# Patient Record
Sex: Male | Born: 1995 | Race: White | Hispanic: No | Marital: Single | Smoking: Never smoker
Health system: Southern US, Community
[De-identification: ages and names within clinical notes are randomized; demographics above are authoritative.]

## PROBLEM LIST (undated history)

## (undated) HISTORY — PX: APPENDECTOMY: SHX54

---
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
## 2017-04-01 IMAGING — CR DG CHEST 2V
2 series · 2 of 2 positions shown · non-contrast
Comparison: Chest radiograph performed 05/06/2016

CLINICAL DATA: Chronic productive cough. Acute onset of congestion
and fever. Initial encounter.

EXAM:
CHEST  2 VIEW

[chest pa]
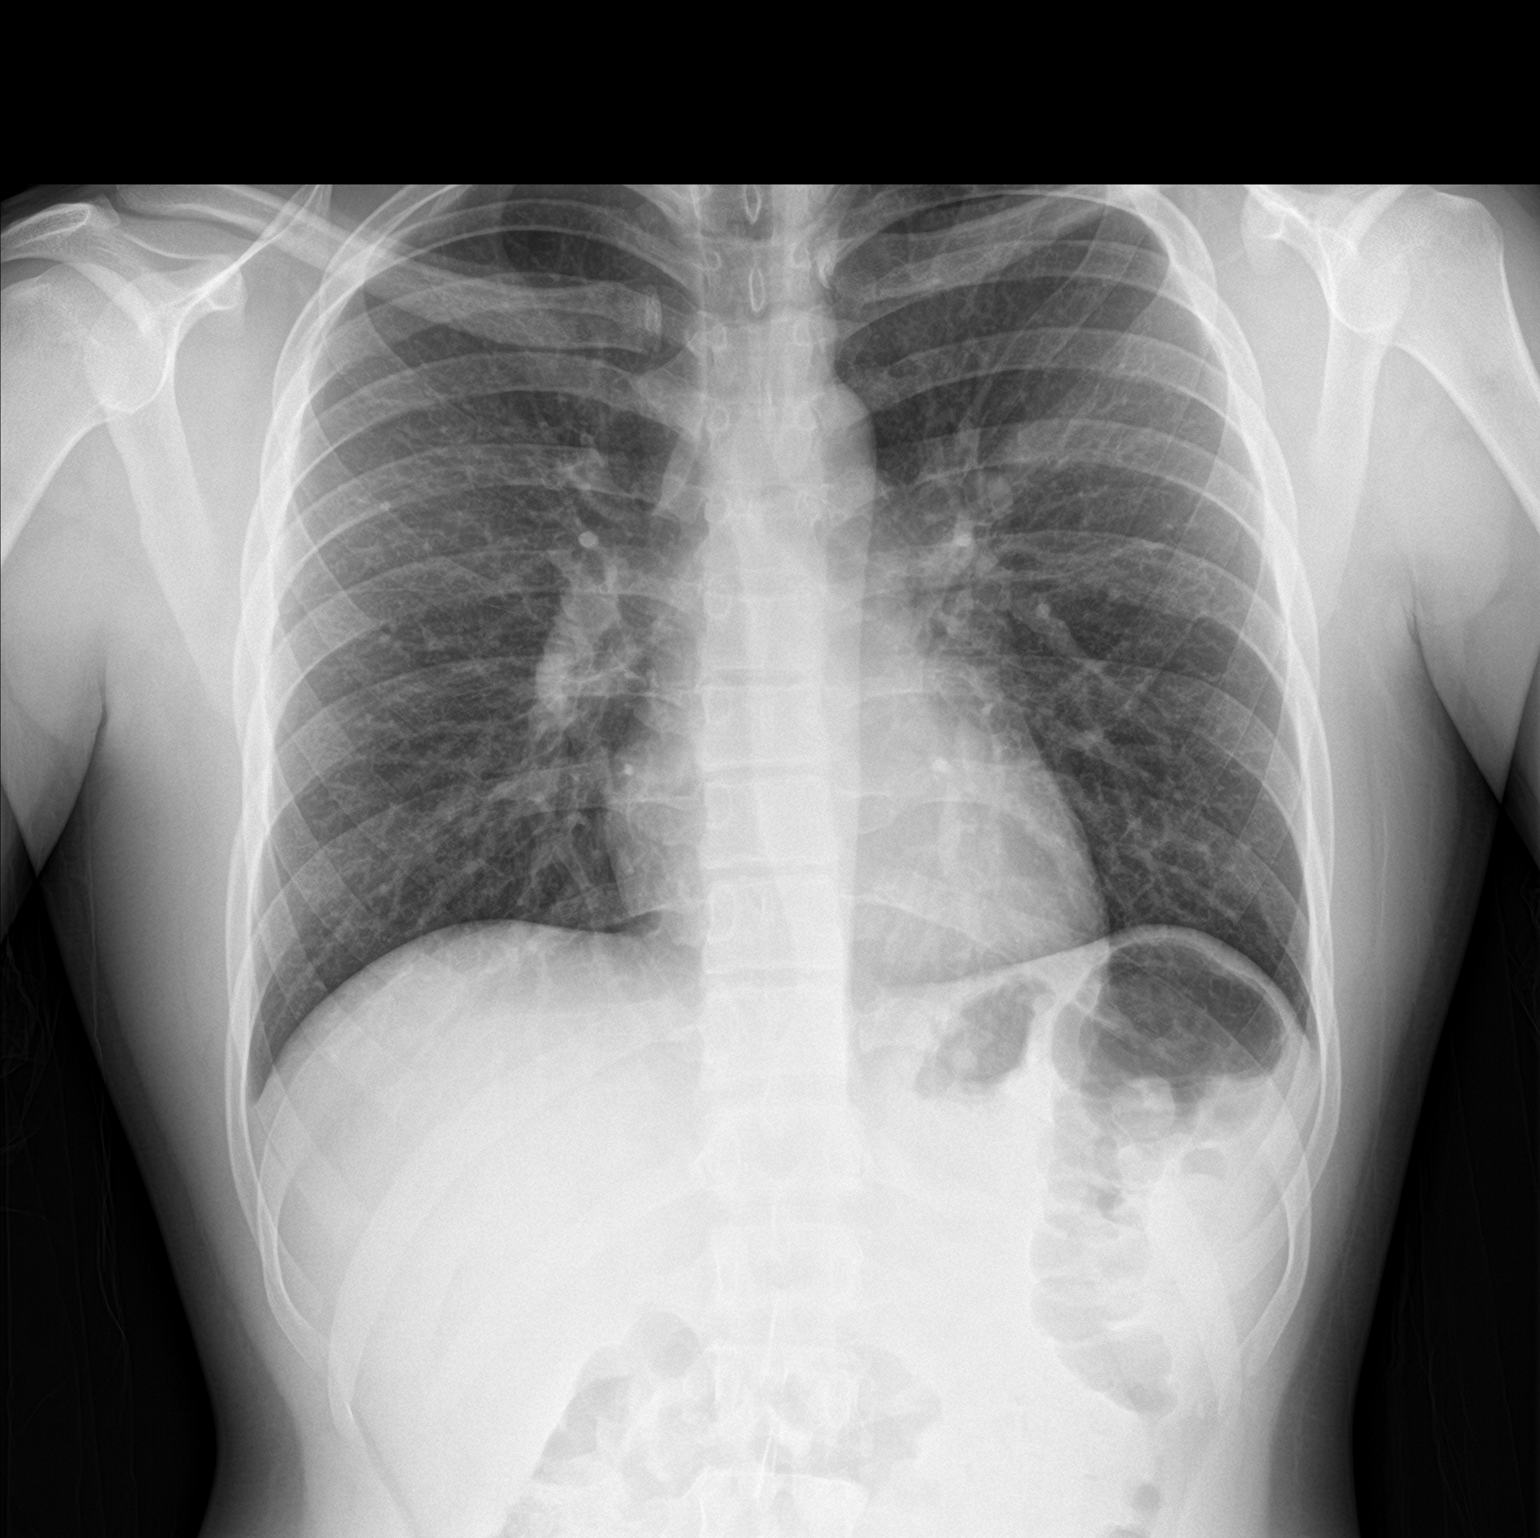

[chest lat]
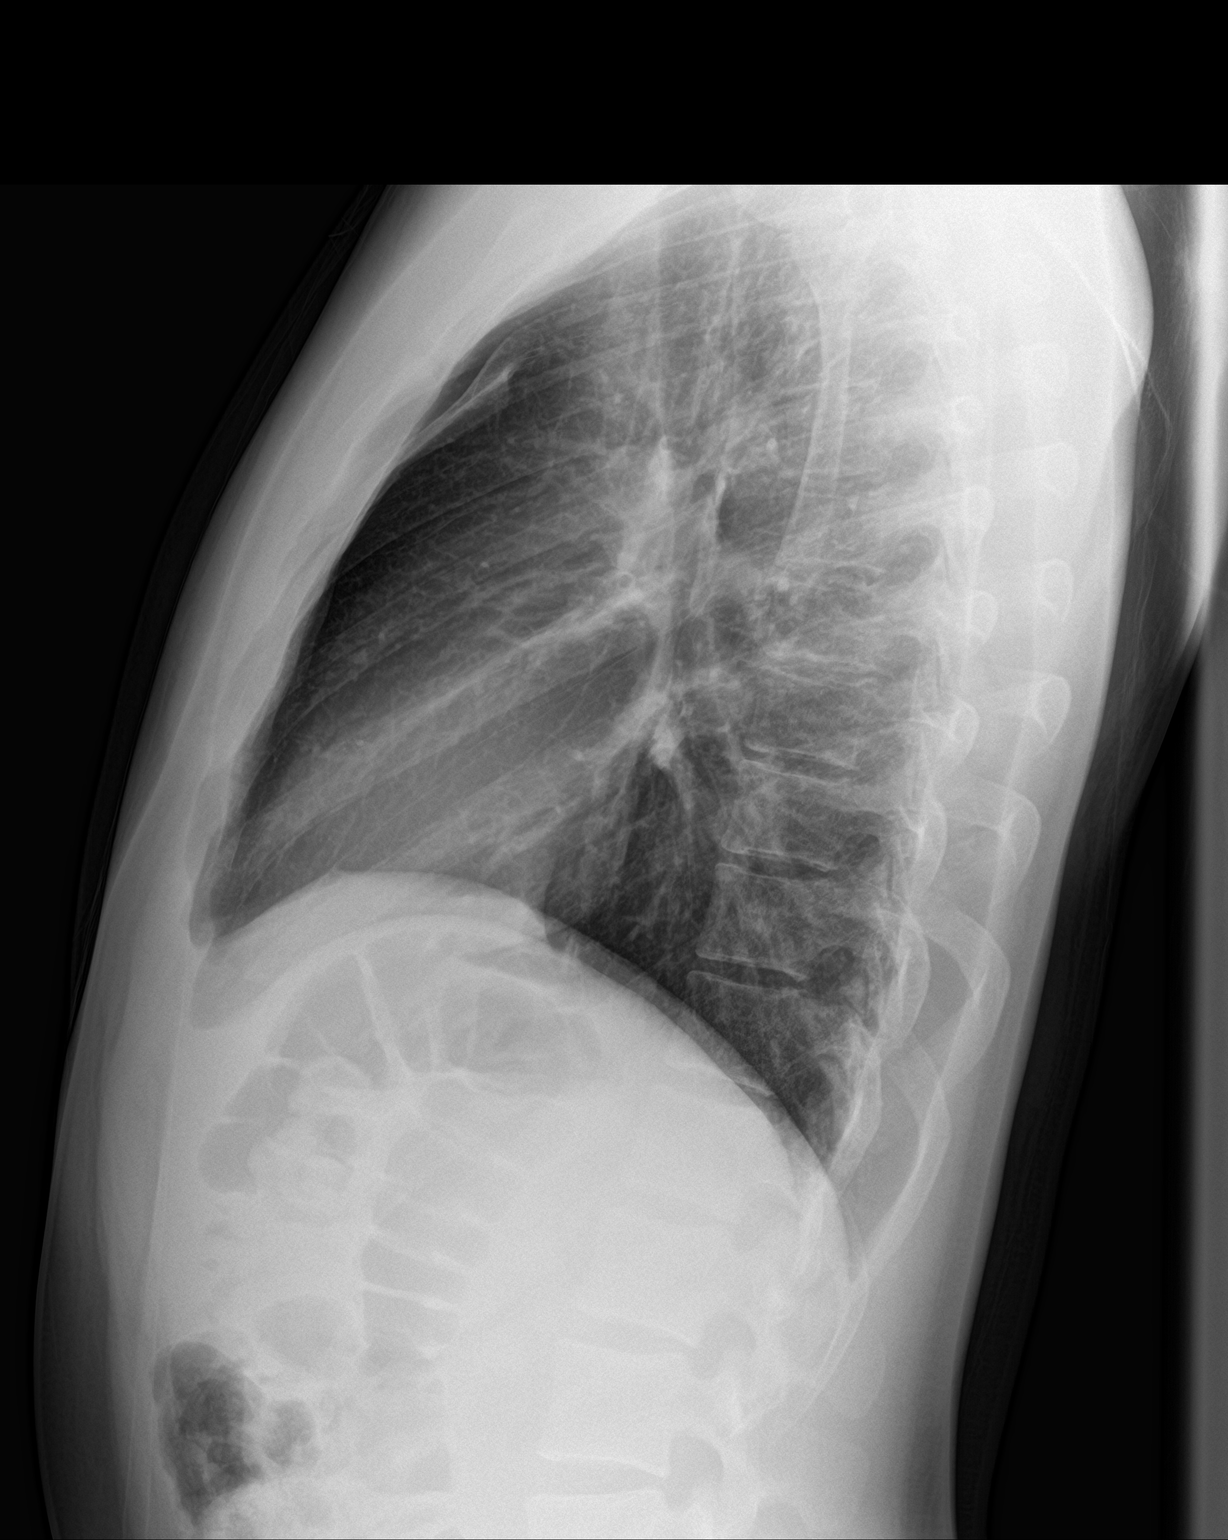

[2 of 2 positions shown; findings below may reference images not displayed]

FINDINGS: The lungs are well-aerated and clear. There is no evidence of focal
opacification, pleural effusion or pneumothorax.

The heart is normal in size; the mediastinal contour is within
normal limits. No acute osseous abnormalities are seen.
IMPRESSION: No acute cardiopulmonary process seen.

## 2017-12-24 ENCOUNTER — Ambulatory Visit: Payer: Self-pay | Admitting: Pediatrics

## 2017-12-25 ENCOUNTER — Ambulatory Visit (INDEPENDENT_AMBULATORY_CARE_PROVIDER_SITE_OTHER): Payer: Medicaid Other | Admitting: Pediatrics

## 2017-12-25 ENCOUNTER — Encounter: Payer: Self-pay | Admitting: Pediatrics

## 2017-12-25 VITALS — BP 118/76 | Temp 97.5°F | Wt 159.0 lb

## 2017-12-25 DIAGNOSIS — R51 Headache: Secondary | ICD-10-CM

## 2017-12-25 DIAGNOSIS — R519 Headache, unspecified: Secondary | ICD-10-CM

## 2017-12-25 DIAGNOSIS — R42 Dizziness and giddiness: Secondary | ICD-10-CM

## 2017-12-25 NOTE — Patient Instructions (Signed)
Dizziness °Dizziness is a common problem. It is a feeling of unsteadiness or light-headedness. You may feel like you are about to faint. Dizziness can lead to injury if you stumble or fall. Anyone can become dizzy, but dizziness is more common in older adults. This condition can be caused by a number of things, including medicines, dehydration, or illness. °Follow these instructions at home: °Eating and drinking °· Drink enough fluid to keep your urine clear or pale yellow. This helps to keep you from becoming dehydrated. Try to drink more clear fluids, such as water. °· Do not drink alcohol. °· Limit your caffeine intake if told to do so by your health care provider. Check ingredients and nutrition facts to see if a food or beverage contains caffeine. °· Limit your salt (sodium) intake if told to do so by your health care provider. Check ingredients and nutrition facts to see if a food or beverage contains sodium. °Activity °· Avoid making quick movements. °? Rise slowly from chairs and steady yourself until you feel okay. °? In the morning, first sit up on the side of the bed. When you feel okay, stand slowly while you hold onto something until you know that your balance is fine. °· If you need to stand in one place for a long time, move your legs often. Tighten and relax the muscles in your legs while you are standing. °· Do not drive or use heavy machinery if you feel dizzy. °· Avoid bending down if you feel dizzy. Place items in your home so that they are easy for you to reach without leaning over. °Lifestyle °· Do not use any products that contain nicotine or tobacco, such as cigarettes and e-cigarettes. If you need help quitting, ask your health care provider. °· Try to reduce your stress level by using methods such as yoga or meditation. Talk with your health care provider if you need help to manage your stress. °General instructions °· Watch your dizziness for any changes. °· Take over-the-counter and  prescription medicines only as told by your health care provider. Talk with your health care provider if you think that your dizziness is caused by a medicine that you are taking. °· Tell a friend or a family member that you are feeling dizzy. If he or she notices any changes in your behavior, have this person call your health care provider. °· Keep all follow-up visits as told by your health care provider. This is important. °Contact a health care provider if: °· Your dizziness does not go away. °· Your dizziness or light-headedness gets worse. °· You feel nauseous. °· You have reduced hearing. °· You have new symptoms. °· You are unsteady on your feet or you feel like the room is spinning. °Get help right away if: °· You vomit or have diarrhea and are unable to eat or drink anything. °· You have problems talking, walking, swallowing, or using your arms, hands, or legs. °· You feel generally weak. °· You are not thinking clearly or you have trouble forming sentences. It may take a friend or family member to notice this. °· You have chest pain, abdominal pain, shortness of breath, or sweating. °· Your vision changes. °· You have any bleeding. °· You have a severe headache. °· You have neck pain or a stiff neck. °· You have a fever. °These symptoms may represent a serious problem that is an emergency. Do not wait to see if the symptoms will go away. Get medical help   right away. Call your local emergency services (911 in the [place] Do not drive yourself to the hospital. °Summary °· Dizziness is a feeling of unsteadiness or light-headedness. This condition can be caused by a number of things, including medicines, dehydration, or illness. °· Anyone can become dizzy, but dizziness is more common in older adults. °· Drink enough fluid to keep your urine clear or pale yellow. Do not drink alcohol. °· Avoid making quick movements if you feel dizzy. Monitor your dizziness for any changes. °This information is not intended to  replace advice given to you by your health care provider. Make sure you discuss any questions you have with your health care provider. °Document Released: 11/01/2000 Document Revised: 06/10/2016 Document Reviewed: 06/10/2016 °Elsevier Interactive Patient Education © 2018 Elsevier Inc. ° °

## 2017-12-25 NOTE — Progress Notes (Signed)
  Subjective:     Patient ID: Charles Vega, male   DOB: 12/19/1995, 22 y.o.   MRN: [id number]  HPI  The patient is here alone for concern about dizziness. He states that this happened about one week ago, while he was playing soccer and it was hot outside. Then, he went to sit in his car and it lasted for about 2 minutes, then it passed. He states that he did drink plenty of water.  He states that he felt dizzy again the next day, he states that he was not doing anything that day, except for hang out with friends and "talk". He states that he did not play soccer that day or any activities.  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 He states that he felt dizzy again at the end of the night, and it was very busy, and he started to feel dizzy. He went to the bathroom to "calm himself" and he felt that he was dizzy. He states that the dizziness lasted about 4 to 5 minutes. He states that he usually eats at least 2 meals a day and drinks water and Gatorades.  He states that now will sometimes feel dizzy when he is walking.  He also states that he has had about 3 all over his head headaches over the past one week and 1/2, since the dizziness started.   Patient is not aware of any family history of any sudden death in his immediate family.    Review of Systems   .Review of Symptoms: General ROS: negative for - fatigue ENT ROS: negative for - nasal congestion Respiratory ROS: no cough, shortness of breath, or wheezing Cardiovascular ROS: no chest pain or dyspnea on exertion Gastrointestinal ROS: no abdominal pain, change in bowel habits, or black or bloody stools  Objective:   Physical Exam BP 118/76   Temp (!) 97.5 F (36.4 C)   Wt 159 lb (72.1 kg)   BMI 24.90 kg/m   General Appearance:  Alert, cooperative, no distress, appears stated age  Head:  Normocephalic, without obvious abnormality, atraumatic  Eyes:  PERRL, conjunctiva/corneas clear, EOM's intact,  fundi benign, both eyes  Ears:  Normal TM's and external ear canals, both ears  Nose: Nares normal, septum midline, mucosa normal, no drainage or sinus tenderness  Throat: Lips, mucosa, and tongue normal; teeth and gums normal  Neck: Supple, symmetrical, trachea midline, no adenopathy  Lungs:   Clear to auscultation bilaterally, respirations unlabored  Heart:  Regular rate and rhythm, S1, S2 normal, no murmur, rub or gallop  Abdomen:   Soft, non-tender, bowel sounds active all four quadrants,  no masses, no organomegaly  Neurologic: Normal       Assessment:     Dizziness Headaches     Plan:     .1. Dizziness Increase water intake to at least 60 ounces per day  Eat at least 3 meals a day Avoid being in environments where he can become very hot  - Ambulatory referral to Neurology  2. Acute nonintractable headache, unspecified headache type Monitor for triggers  Good sleep hygiene  Minimal screen time  - Ambulatory referral to Neurology

## 2017-12-31 ENCOUNTER — Encounter: Payer: Self-pay | Admitting: Neurology

## 2018-02-21 ENCOUNTER — Ambulatory Visit: Payer: Medicaid Other | Admitting: Neurology

## 2018-03-05 ENCOUNTER — Ambulatory Visit (INDEPENDENT_AMBULATORY_CARE_PROVIDER_SITE_OTHER): Payer: Medicaid Other

## 2018-03-05 DIAGNOSIS — Z23 Encounter for immunization: Secondary | ICD-10-CM | POA: Diagnosis not present

## 2018-03-20 ENCOUNTER — Encounter: Payer: Self-pay | Admitting: Pediatrics

## 2018-05-31 ENCOUNTER — Telehealth: Payer: Self-pay

## 2018-05-31 NOTE — Telephone Encounter (Signed)
Agree 

## 2018-05-31 NOTE — Telephone Encounter (Signed)
Pt has been having symptoms of cough, body aches, sore throat, "fever" not taken with thermometer just by touch x 4 days. Let pt know that this sounds like possible flu. Due to it being more than 48 hours let pt know that there is nothing we can do for it other than to treat the symptoms. Let pt know that he can take tylenol and motrin if needed for the possible fever, sore throat and body aches. Let pt know that the flu can take about a 1 week to clear up in the mean time to try the above suggestions, make sure to cover mouth when sneezing and coughing, wash hand often with soap and water, limit contact with other to prevent spread and to wait until 24 hours after the fever is gone to return to school or work. Pt understood.

## 2018-10-22 ENCOUNTER — Other Ambulatory Visit: Payer: Self-pay

## 2018-10-22 ENCOUNTER — Encounter: Payer: Self-pay | Admitting: Pediatrics

## 2018-10-22 ENCOUNTER — Ambulatory Visit (INDEPENDENT_AMBULATORY_CARE_PROVIDER_SITE_OTHER): Payer: BC Managed Care – PPO | Admitting: Pediatrics

## 2018-10-22 VITALS — Temp 98.8°F | Wt 156.0 lb

## 2018-10-22 DIAGNOSIS — J029 Acute pharyngitis, unspecified: Secondary | ICD-10-CM | POA: Diagnosis not present

## 2018-10-22 DIAGNOSIS — J039 Acute tonsillitis, unspecified: Secondary | ICD-10-CM | POA: Diagnosis not present

## 2018-10-22 LAB — POCT RAPID STREP A (OFFICE): Rapid Strep A Screen: NEGATIVE

## 2018-10-22 MED ORDER — AMOXICILLIN 875 MG PO TABS: ORAL_TABLET | ORAL | 0 refills | Status: AC

## 2018-10-22 NOTE — Progress Notes (Signed)
Subjective:     Charles Vega is a 23 y.o. male who presents for evaluation of sore throat. Associated symptoms include pain while swallowing. Onset of symptoms was 4 days ago, and have been unchanged since that time. He is drinking plenty of fluids. He has not had a recent close exposure to someone with proven streptococcal pharyngitis.  The following portions of the patient's history were reviewed and updated as appropriate: allergies, current medications, past medical history and problem list.  Review of Systems Constitutional: negative for fevers Eyes: negative for redness Ears, nose, mouth, throat, and face: negative except for headaches the first 3 days of illness Respiratory: negative for cough Gastrointestinal: negative for diarrhea and vomiting    Objective:    Temp 98.8 F (37.1 C)   Wt 156 lb (70.8 kg)   BMI 24.43 kg/m  General appearance: alert and cooperative Head: Normocephalic, without obvious abnormality, atraumatic Eyes: negative findings: conjunctivae and sclerae normal Ears: normal TM's and external ear canals both ears Nose: no discharge Throat: abnormal findings: moderate oropharyngeal erythema and swelling of tonsils  Lungs: clear to auscultation bilaterally Heart: regular rate and rhythm, S1, S2 normal, no murmur, click, rub or gallop  Laboratory Strep test done. Results:negative.    Assessment:     Tonsillitis.    Plan:  .1. Tonsillitis Will treat based on history and exam  - POCT rapid strep A negative  - Culture, Group A Strep pending  - amoxicillin (AMOXIL) 875 MG tablet; Take one tablet twice a day for 10 days  Dispense: 20 tablet; Refill: 0   Patient placed on antibiotics. Use of OTC analgesics recommended as well as salt water gargles. Patient advised that he will be infectious for 24 hours after starting antibiotics. Follow up as needed.

## 2018-10-22 NOTE — Patient Instructions (Signed)
Tonsillitis    Tonsillitis is an infection of the throat that causes the tonsils to become red, tender, and swollen. Tonsils are tissues in the back of your throat. Each tonsil has crevices (crypts). Tonsils normally work to protect the body from infection.  What are the causes?  Sudden (acute) tonsillitis may be caused by a virus or bacteria, including streptococcal bacteria. Long-lasting (chronic) tonsillitis occurs when the crypts of the tonsils become filled with pieces of food and bacteria, which makes it easy for the tonsils to become repeatedly infected.  Tonsillitis can be spread from person to person (is contagious). It may be spread by inhaling droplets that are released with coughing or sneezing. You may also come into contact with viruses or bacteria on surfaces, such as cups or utensils.  What are the signs or symptoms?  Symptoms of this condition include:  · A sore throat. This may include trouble swallowing.  · White patches on the tonsils.  · Swollen tonsils.  · Fever.  · Headache.  · Tiredness.  · Loss of appetite.  · Snoring during sleep when you did not snore before.  · Small, foul-smelling, yellowish-white pieces of material (tonsilloliths) that you occasionally cough up or spit out. These can cause you to have bad breath.  How is this diagnosed?  This condition is diagnosed with a physical exam. Diagnosis can be confirmed with the results of lab tests, including a throat culture.  How is this treated?  Treatment for this condition depends on the cause, but usually focuses on treating the symptoms associated with it. Treatment may include:  · Medicines to relieve pain and manage fever.  · Steroid medicines to reduce swelling.  · Antibiotic medicines if the condition is caused by bacteria.  If attacks of tonsillitis are severe and frequent, your health care provider may recommend surgery to remove the tonsils (tonsillectomy).  Follow these instructions at home:  Medicines  · Take over-the-counter  and prescription medicines only as told by your health care provider.  · If you were prescribed an antibiotic medicine, take it as told by your health care provider. Do not stop taking the antibiotic even if you start to feel better.  Eating and drinking  · Drink enough fluid to keep your urine clear or pale yellow.  · While your throat is sore, eat soft or liquid foods, such as sherbet, soups, or instant breakfast drinks.  · Drink warm liquids.  · Eat frozen ice pops.  General instructions  · Rest as much as possible and get plenty of sleep.  · Gargle with a salt-water mixture 3-4 times a day or as needed. To make a salt-water mixture, completely dissolve ½-1 tsp of salt in 1 cup of warm water.  · Wash your hands regularly with soap and water. If soap and water are not available, use hand sanitizer.  · Do not share cups, bottles, or other utensils until your symptoms have gone away.  · Do not smoke. This can help your symptoms and prevent the infection from coming back. If you need help quitting, ask your health care provider.  · Keep all follow-up visits as told by your health care provider. This is important.  Contact a health care provider if:  · You notice large, tender lumps in your neck that were not there before.  · You have a fever that does not go away after 2-3 days.  · You develop a rash.  · You cough up a green, yellow-brown,   or bloody substance.  · You cannot swallow liquids or food for 24 hours.  · Only one of your tonsils is swollen.  Get help right away if:  · You develop any new symptoms, such as vomiting, severe headache, stiff neck, chest pain, trouble breathing, or trouble swallowing.  · You have severe throat pain along with drooling or voice changes.  · You have severe pain that is not controlled with medicines.  · You cannot fully open your mouth.  · You develop redness, swelling, or severe pain anywhere in your neck.  Summary  · Tonsillitis is an infection of the throat that causes the  tonsils to become red, tender, and swollen.  · Tonsillitis may be caused by a virus or bacteria.  · Rest as much as possible. Get plenty of sleep.  This information is not intended to replace advice given to you by your health care provider. Make sure you discuss any questions you have with your health care provider.  Document Released: 02/15/2005 Document Revised: 06/13/2016 Document Reviewed: 06/13/2016  Elsevier Interactive Patient Education © 2019 Elsevier Inc.

## 2018-10-24 DIAGNOSIS — Z20828 Contact with and (suspected) exposure to other viral communicable diseases: Secondary | ICD-10-CM | POA: Diagnosis not present

## 2018-10-25 LAB — CULTURE, GROUP A STREP: Strep A Culture: NEGATIVE

## 2019-05-28 ENCOUNTER — Ambulatory Visit
Admission: EM | Admit: 2019-05-28 | Discharge: 2019-05-28 | Disposition: A | Discharge status: Home / Self Care | Payer: BC Managed Care – PPO

## 2019-05-28 NOTE — ED Notes (Signed)
Dr Leonides Grills spoke to patient

## 2020-11-29 ENCOUNTER — Encounter: Payer: Self-pay | Admitting: Pediatrics

## 2022-04-05 ENCOUNTER — Ambulatory Visit
Admission: EM | Admit: 2022-04-05 | Discharge: 2022-04-05 | Disposition: A | Discharge status: Home / Self Care | Payer: BC Managed Care – PPO | Attending: Nurse Practitioner | Admitting: Nurse Practitioner

## 2022-04-05 ENCOUNTER — Ambulatory Visit (INDEPENDENT_AMBULATORY_CARE_PROVIDER_SITE_OTHER): Payer: BC Managed Care – PPO

## 2022-04-05 DIAGNOSIS — R079 Chest pain, unspecified: Secondary | ICD-10-CM | POA: Diagnosis not present

## 2022-04-05 DIAGNOSIS — R0781 Pleurodynia: Secondary | ICD-10-CM

## 2022-04-05 MED ORDER — IBUPROFEN 800 MG PO TABS: 800.0000 mg | ORAL_TABLET | Freq: Three times a day (TID) | ORAL | 0 refills | Status: AC | PRN

## 2022-04-05 NOTE — Discharge Instructions (Signed)
The chest x-ray does not show any acute findings or abnormalities. Take medication as prescribed. Increase fluids and allow for plenty of rest. Recommend the use of ice or heat.  Apply ice for pain or swelling, heat for spasm or stiffness.  Apply for 20 minutes, remove for 1 hour, then repeat is much as possible. Gentle stretching and range of motion exercises while symptoms persist. Follow-up in the emergency department if you have worsening rib pain, or develop shortness of breath, difficulty breathing, or other concerns. Follow-up as needed.

## 2022-04-05 NOTE — ED Triage Notes (Signed)
Pt states right rib pain for the past 2 day, denies injury.  States he took advil with no relief.

## 2022-04-05 NOTE — ED Provider Notes (Signed)
[facility]    CSN: [id number] Arrival date & time: 04/05/22  1922      History   Chief Complaint Chief Complaint  Patient presents with   Rib Injury    HPI Charles Vega is a 26 y.o. male.   The history is provided by the patient.   Patient presents for complaints of right-sided rib cage pain has been present for the past 2 days.  Patient denies injury or trauma.  He states the pain worsens when he coughs and deep breathes.  Denies shortness of breath, difficulty breathing, movement pain.  Patient states he works with [employer], but has not performed any strenuous work at his job.  He states he has been taking Advil with minimal relief.  History reviewed. No pertinent past medical history.  Patient Active Problem List   Diagnosis Date Noted   Acute appendicitis 10/11/2014   Abdominal pain 10/10/2014   Nausea with vomiting     Past Surgical History:  Procedure Laterality Date   APPENDECTOMY     LAPAROSCOPIC APPENDECTOMY N/A 10/10/2014   Procedure: APPENDECTOMY LAPAROSCOPIC;  Surgeon: Jimmye Norman, MD;  Location: [facility] OR;  Service: General;  Laterality: N/A;       Home Medications    Prior to Admission medications   Medication Sig Start Date End Date Taking? Authorizing Provider  ibuprofen (ADVIL) 800 MG tablet Take 1 tablet (800 mg total) by mouth every 8 (eight) hours as needed. 04/05/22  Yes Aramis Weil-Warren, Sadie Haber, NP  albuterol (PROVENTIL HFA;VENTOLIN HFA) 108 (90 Base) MCG/ACT inhaler Inhale 1-2 puffs into the lungs every 6 (six) hours as needed for wheezing or shortness of breath. 05/06/16   Rancour, Jeannett Senior, MD  amoxicillin (AMOXIL) 875 MG tablet Take one tablet twice a day for 10 days 10/22/18   Rosiland Oz, MD  azithromycin (ZITHROMAX Z-PAK) 250 MG tablet 2 tabs x 1 dose, then 1 tab qd x4 05/17/16   McDonell, Alfredia Client, MD  benzonatate (TESSALON) 100 MG capsule Take 1 capsule (100 mg total) by mouth 3 (three) times daily as needed for  cough. 05/30/16   Wojeck, Hinton Dyer, NP  HYDROcodone-acetaminophen (NORCO/VICODIN) 5-325 MG per tablet Take 1-2 tablets by mouth every 4 (four) hours as needed for moderate pain. 10/12/14   Jimmye Norman, MD  pseudoephedrine (SUDAFED) 30 MG tablet Take 1 tablet (30 mg total) by mouth every 4 (four) hours as needed for congestion. 05/30/16   Wojeck, Hinton Dyer, NP    Family History Family History  Problem Relation Age of Onset   Diabetes Paternal Aunt    Healthy Mother    High Cholesterol Father    Healthy Sister    Healthy Brother    Healthy Sister     Social History Social History   Tobacco Use   Smoking status: Never   Smokeless tobacco: Never  Substance Use Topics   Alcohol use: No   Drug use: No     Allergies   Patient has no known allergies.   Review of Systems Review of Systems Per HPI  Physical Exam Triage Vital Signs ED Triage Vitals [04/05/22 1930]  Enc Vitals Group     BP (!) 151/93     Pulse Rate 76     Resp 16     Temp 97.6 F (36.4 C)     Temp Source Oral     SpO2 98 %     Weight      Height  Head Circumference      Peak Flow      Pain Score 5     Pain Loc      Pain Edu?      Excl. in GC?    No data found.  Updated Vital Signs BP (!) 151/93 (BP Location: Right Arm)   Pulse 76   Temp 97.6 F (36.4 C) (Oral)   Resp 16   SpO2 98%   Visual Acuity Right Eye Distance:   Left Eye Distance:   Bilateral Distance:    Right Eye Near:   Left Eye Near:    Bilateral Near:     Physical Exam Vitals and nursing note reviewed.  Constitutional:      General: He is not in acute distress.    Appearance: Normal appearance.  HENT:     Head: Normocephalic.  Eyes:     Extraocular Movements: Extraocular movements intact.     Pupils: Pupils are equal, round, and reactive to light.  Cardiovascular:     Rate and Rhythm: Normal rate and regular rhythm.     Pulses: Normal pulses.     Heart sounds: Normal heart sounds.  Pulmonary:     Effort: Pulmonary  effort is normal. No respiratory distress.     Breath sounds: Normal breath sounds. No stridor. No wheezing, rhonchi or rales.  Chest:     Chest wall: Tenderness (Right anterior chest wall at ribs 7 and 8) present.  Abdominal:     General: Bowel sounds are normal.     Palpations: Abdomen is soft.  Musculoskeletal:     Cervical back: Normal range of motion.  Skin:    General: Skin is warm and dry.  Neurological:     General: No focal deficit present.     Mental Status: He is alert and oriented to person, place, and time.  Psychiatric:        Mood and Affect: Mood normal.        Behavior: Behavior normal.      UC Treatments / Results  Labs (all labs ordered are listed, but only abnormal results are displayed) Labs Reviewed - No data to display  EKG   Radiology DG Chest 2 View  Result Date: 04/05/2022 CLINICAL DATA:  Right chest pain EXAM: CHEST - 2 VIEW COMPARISON:  05/29/2016 FINDINGS: Cardiac size is within normal limits. There are no signs of pulmonary edema or focal pulmonary consolidation. There is no pleural effusion or pneumothorax. Anterior aspect of left third rib is wider than usual, possible congenital normal variation. This finding appears stable. IMPRESSION: No active cardiopulmonary disease. Electronically Signed   By: Ernie Avena M.D.   On: 04/05/2022 19:55    Procedures Procedures (including critical care time)  Medications Ordered in UC Medications - No data to display  Initial Impression / Assessment and Plan / UC Course  I have reviewed the triage vital signs and the nursing notes.  Pertinent labs & imaging results that were available during my care of the patient were reviewed by me and considered in my medical decision making (see chart for details).  Patient is well-appearing, he is in no acute distress, vital signs are stable.  Rib pain on right side  Unknown etiology of rib pain on the right side at this time.  Differential diagnoses  include costochondritis, pleurisy, or chest wall muscle strain. Ibuprofen 800 mg for pain and inflammation. Warm or cool compresses to help with pain or discomfort. Gentle stretching and range of  motion exercises if symptoms persist. Strict follow-up and ER precautions were provided.  Patient verbalizes understanding.  All questions were answered.  Patient is stable at discharge. Final Clinical Impressions(s) / UC Diagnoses   Final diagnoses:  Rib pain on right side     Discharge Instructions      The chest x-ray does not show any acute findings or abnormalities. Take medication as prescribed. Increase fluids and allow for plenty of rest. Recommend the use of ice or heat.  Apply ice for pain or swelling, heat for spasm or stiffness.  Apply for 20 minutes, remove for 1 hour, then repeat is much as possible. Gentle stretching and range of motion exercises while symptoms persist. Follow-up in the emergency department if you have worsening rib pain, or develop shortness of breath, difficulty breathing, or other concerns. Follow-up as needed.     ED Prescriptions     Medication Sig Dispense Auth. Provider   ibuprofen (ADVIL) 800 MG tablet Take 1 tablet (800 mg total) by mouth every 8 (eight) hours as needed. 30 tablet Tambria Pfannenstiel-Warren, Sadie Haber, NP      PDMP not reviewed this encounter.   Abran Cantor, NP 04/06/22 [phone]

## 2022-04-07 ENCOUNTER — Other Ambulatory Visit: Payer: Self-pay

## 2022-04-07 ENCOUNTER — Emergency Department (HOSPITAL_COMMUNITY)
Admission: EM | Admit: 2022-04-07 | Discharge: 2022-04-07 | Disposition: A | Discharge status: Home / Self Care | Payer: BC Managed Care – PPO | Attending: Emergency Medicine | Admitting: Emergency Medicine

## 2022-04-07 ENCOUNTER — Emergency Department (HOSPITAL_COMMUNITY): Payer: BC Managed Care – PPO

## 2022-04-07 ENCOUNTER — Encounter (HOSPITAL_COMMUNITY): Payer: Self-pay

## 2022-04-07 DIAGNOSIS — K8020 Calculus of gallbladder without cholecystitis without obstruction: Secondary | ICD-10-CM | POA: Insufficient documentation

## 2022-04-07 DIAGNOSIS — R1011 Right upper quadrant pain: Secondary | ICD-10-CM | POA: Diagnosis present

## 2022-04-07 NOTE — Discharge Instructions (Addendum)
You were seen today for RUQ pain. We did not identify any emergent cause for your symptoms. Your evaluation is most consistent with cholelithiasis without obstruction.   Plan and next steps:   The following may be helpful in managing your symptoms:   Pain- Lidocaine Patches  Apply to affected area for up to 12 hours at a time.   Pain/Fever- Adult Tylenol dosing:  650 mg orally every 4 to 6 hours as needed, MAX: 3250 mg/24 hours   (Extra-strength) 1000 mg orally every 6 hours as needed; MAX: 3000 mg/24 hours   Do not use if you have liver disease. Read the label on the bottle.   Pain/Fever- Adult Ibuprofen Dosing  200 to 400 mg orally every 4 to 6 hours as needed; MAX 1200 mg/day; do not take longer than 10 days   Do not use if you have kidney disease. Read the label on the bottle   Gastritis/Heartburn- Omeprazole Take 20mg  Daily for 14 days  Findings:  You may see all of your lab and imaging results utilizing our online portal! Look in this document or ask a team member for your mychart* access information. The most notable results have additionally been verbally communicated with you and your bedside family.    Follow-up Plan:   Follow up with the patient's normal primary care provider for monitoring of this condition within 48 hours.   Signs/Symptoms that would warrant return to the ED:  Please return to the ED if you experience worsening of symptoms or any abrupt changes in your health. Standard of care precautions for your chief complaint have already been verbally communicated with you. Always be on alert for fevers, chills, shortness of breath, chest pains, or sudden changes that warrant immediate evaluation.    Thank you for allowing to be a part of you and your families' care.   [place] MD

## 2022-04-07 NOTE — ED Notes (Signed)
Discharge instructions reviewed with patient. Patient denies any questions or concerns. Patient ambulatory out of ED with family 

## 2022-04-07 NOTE — ED Provider Notes (Signed)
[facility] EMERGENCY DEPARTMENT Provider Note   CSN: [id number] Arrival date & time: 04/07/22  2353     History Chief Complaint  Patient presents with   rib cage pain    HPI Charles Vega is a 26 y.o. male presenting for right upper quadrant pain.  He states that he has had approximately 4 days of intermittent right upper quadrant pain.  It is intermittent nature and tends to flare.  He denies any history of similar.  He denies fevers or chills, nausea vomiting, syncope or shortness of breath.  He was seen by urgent care diagnosed with nonspecific musculoskeletal symptoms started on Motrin.  The symptoms have been improving with these medications.  He comes in today for reassessment and second opinion given persistent recurrence of symptoms over the past 4 days.   Patient's recorded medical, surgical, social, medication list and allergies were reviewed in the Snapshot window as part of the initial history.   Review of Systems   Review of Systems  Constitutional:  Negative for chills and fever.  HENT:  Negative for ear pain and sore throat.   Eyes:  Negative for pain and visual disturbance.  Respiratory:  Negative for cough and shortness of breath.   Cardiovascular:  Negative for chest pain and palpitations.  Gastrointestinal:  Positive for abdominal pain. Negative for vomiting.  Genitourinary:  Negative for dysuria and hematuria.  Musculoskeletal:  Negative for arthralgias and back pain.  Skin:  Negative for color change and rash.  Neurological:  Negative for seizures and syncope.  All other systems reviewed and are negative.   Physical Exam Updated Vital Signs BP 132/76 (BP Location: Left Arm)   Pulse 71   Temp 98.8 F (37.1 C)   Resp 14   Ht 5\' 7"  (1.702 m)   Wt 74.8 kg   SpO2 100%   BMI 25.84 kg/m  Physical Exam Vitals and nursing note reviewed.  Constitutional:      General: He is not in acute distress.    Appearance: He is  well-developed.  HENT:     Head: Normocephalic and atraumatic.  Eyes:     Conjunctiva/sclera: Conjunctivae normal.  Cardiovascular:     Rate and Rhythm: Normal rate and regular rhythm.     Heart sounds: No murmur heard. Pulmonary:     Effort: Pulmonary effort is normal. No respiratory distress.     Breath sounds: Normal breath sounds.  Abdominal:     Palpations: Abdomen is soft.     Tenderness: There is abdominal tenderness. There is no right CVA tenderness, left CVA tenderness or guarding.  Musculoskeletal:        General: No swelling.     Cervical back: Neck supple.  Skin:    General: Skin is warm and dry.     Capillary Refill: Capillary refill takes less than 2 seconds.  Neurological:     Mental Status: He is alert.  Psychiatric:        Mood and Affect: Mood normal.      ED Course/ Medical Decision Making/ A&P Clinical Course as of 04/07/22 1208  Fri Apr 07, 2022  Apr 09, 2022 Possible QD.  [CC]    Clinical Course User Index [CC] [id number], MD    Procedures Ultrasound ED Abd  Date/Time: 04/07/2022 12:13 PM  Performed by: 04/09/2022, MD Authorized by: Glyn Ade, MD   Procedure details:    Indications: abdominal pain     Assessment for:  Gallstones   Right  renal:  Visualized   Hepatobiliary:  Visualized    Images: archived   Study Limitations: body habitus Hepatobiliary findings:    Common bile duct:  Normal   Gallbladder wall:  Normal   Gallbladder stones: identified     Mass: not identified     Intra-abdominal fluid: not identified     Polyps: not identified     Sonographic Murphy's sign: negative      Medications Ordered in ED Medications - No data to display  Medical Decision Making:    Charles Vega is a 26 y.o. male who presented to the ED today with RUQ pain detailed above.     Patient placed on continuous vitals and telemetry monitoring while in ED which was reviewed periodically.   Complete initial physical exam  performed, notably the patient  was hemodynamically stable in no acute distress.  He has diffuse abdominal tenderness that is very mild.  He has no Murphy sign..      Reviewed and confirmed nursing documentation for past medical history, family history, social history.    Initial Assessment:   With the patient's presentation of right upper quadrant pain, most likely diagnosis is musculoskeletal pain versus developing cholelithiasis. Other diagnoses were considered including (but not limited to) cystitis, small bowel obstruction, pneumonia, pneumothorax, rib fracture. These are considered less likely due to history of present illness and physical exam findings.   This is most consistent with an acute life/limb threatening illness complicated by underlying chronic conditions. Furthermore, I considered ACS though this seems consistently less likely given his protracted nature of symptoms, reassuring EKG in triage, lack of risk factors including comorbidities. Furthermore I considered pulmonary embolism however patient is PE RC negative at this time.  If symptoms are persistent he may need reassessment. Aortic dissection seems grossly inconsistent with the mild nature of pain, location. Initial Plan:  Point-of-care right upper quadrant ultrasound performed in the emergency department as above.  In short, cholelithiasis appreciated without bile duct dilation or wall thickening. CXR to evaluate for structural/infectious intrathoracic pathology.  EKG to evaluate for cardiac pathology. Objective evaluation as below reviewed with plan for close reassessment  Initial Study Results:   EKG EKG was reviewed independently. Rate, rhythm, axis, intervals all examined and without medically relevant abnormality. ST segments without concerns for elevations.    Radiology  All images reviewed independently. Agree with radiology report at this time.   DG Ribs Unilateral W/Chest Right  Result Date:  04/07/2022 CLINICAL DATA:  Rib pain starting on Saturday EXAM: RIGHT RIBS AND CHEST - 3+ VIEW COMPARISON:  04/05/2022 chest radiograph FINDINGS: No fracture or other bone lesions are seen involving the ribs. There is no evidence of pneumothorax or pleural effusion. Both lungs are clear. Heart size and mediastinal contours are within normal limits. IMPRESSION: Negative. Electronically Signed   By: Jorje Guild M.D.   On: 04/07/2022 06:15      Final Assessment and Plan:   Patient ultimately was observed in the emergency room for 6 and half hours.  By time my evaluation, he was feeling better and requesting discharge.  I performed a point-of-care ultrasound above demonstrating cholelithiasis.  Would prefer to have labs to completely rule out biliary obstruction, but given identified common bile duct without evidence of obstruction at this time, I do believe is reasonable for patient to be discharged.  Informed patient of all of the above.  He stated he will follow-up with a PCP within 48 hours for reassessment of his  symptoms and outpatient labs and ultrasound.  Given his understanding of the situation, well appearance and protracted emergency department observation, I believe the patient is stable for outpatient care management at this time.  Strict return precautions reinforced the patient expressed understanding.  He is ambulatory tolerating p.o. intake at this time in no acute distress.   Disposition:  I have considered need for hospitalization, however, considering all of the above, I believe this patient is stable for discharge at this time.  Patient/family educated about specific return precautions for given chief complaint and symptoms.  Patient/family educated about follow-up with PCP.     Patient/family expressed understanding of return precautions and need for follow-up. Patient spoken to regarding all imaging and laboratory results and appropriate follow up for these results. All education  provided in verbal form with additional information in written form. Time was allowed for answering of patient questions. Patient discharged.    Emergency Department Medication Summary:   Medications - No data to display       Clinical Impression:  1. Right upper quadrant abdominal pain   2. Calculus of gallbladder without cholecystitis without obstruction      Data Unavailable   Final Clinical Impression(s) / ED Diagnoses Final diagnoses:  Right upper quadrant abdominal pain  Calculus of gallbladder without cholecystitis without obstruction    Rx / DC Orders ED Discharge Orders     None         Tretha Sciara, MD 04/07/22 1215

## 2022-04-07 NOTE — ED Triage Notes (Signed)
Pt arrived POV from home c/o RUQ pain/right rib cage pain. Pt states this pain started last Sunday. Pt was seen at urgent care and given ibuprofen that helps a little but the pain is still there. Pt denies any N/V/D.

## 2022-04-07 NOTE — ED Provider Triage Note (Signed)
Emergency Medicine Provider Triage Evaluation Note  Charles Vega , a 26 y.o. male  was evaluated in triage.  Pt complains of right ribcage pain. States that same began on Sunday and has been persistent since. Was seen by urgent care and thought to be MSK, was discharged with NSAIDs and RICE which he has been trying without relief. States that he is unable to get comfortable due to pain. Denies any injury. Pain is worse with inspiration. No cardiac history  Review of Systems  Positive:  Negative:   Physical Exam  BP 137/88 (BP Location: Left Arm)   Pulse 71   Temp 98.2 F (36.8 C)   Resp 16   Ht 5\' 7"  (1.702 m)   Wt 74.8 kg   SpO2 98%   BMI 25.84 kg/m  Gen:   Awake, no distress   Resp:  Normal effort  MSK:   Moves extremities without difficulty  Other:  Tenderness noted to palpation of right ribcage. No abdominal tenderness  Medical Decision Making  Medically screening exam initiated at 5:53 AM.  Appropriate orders placed.  Charles Vega was informed that the remainder of the evaluation will be completed by another provider, this initial triage assessment does not replace that evaluation, and the importance of remaining in the ED until their evaluation is complete.     Dorann Lodge, PA-C 04/07/22 [phone]

## 2022-06-02 ENCOUNTER — Ambulatory Visit: Payer: BC Managed Care – PPO | Admitting: Family Medicine

## 2024-01-26 ENCOUNTER — Ambulatory Visit
